# Patient Record
Sex: Male | Born: 1955 | Race: Black or African American | Hispanic: No | Marital: Married | Smoking: Current every day smoker
Health system: Southern US, Community
[De-identification: ages and names within clinical notes are randomized; demographics above are authoritative.]

---

## 2008-05-10 ENCOUNTER — Emergency Department: Payer: Self-pay | Admitting: Emergency Medicine

## 2011-04-12 ENCOUNTER — Emergency Department: Payer: Self-pay | Admitting: Emergency Medicine

## 2014-10-31 ENCOUNTER — Emergency Department: Payer: Self-pay | Admitting: Emergency Medicine

## 2014-10-31 LAB — COMPREHENSIVE METABOLIC PANEL
ALK PHOS: 74 U/L
ALT: 260 U/L — AB
AST: 197 U/L — AB (ref 15–37)
Albumin: 3.4 g/dL (ref 3.4–5.0)
Anion Gap: 1 — ABNORMAL LOW (ref 7–16)
BUN: 13 mg/dL (ref 7–18)
Bilirubin,Total: 0.6 mg/dL (ref 0.2–1.0)
CO2: 26 mmol/L (ref 21–32)
Calcium, Total: 8.7 mg/dL (ref 8.5–10.1)
Chloride: 113 mmol/L — ABNORMAL HIGH (ref 98–107)
Creatinine: 1.01 mg/dL (ref 0.60–1.30)
EGFR (Non-African Amer.): >60
Glucose: 81 mg/dL (ref 65–99)
Osmolality: 279 (ref 275–301)
Potassium: 4 mmol/L (ref 3.5–5.1)
Sodium: 140 mmol/L (ref 136–145)
Total Protein: 7.8 g/dL (ref 6.4–8.2)

## 2014-10-31 LAB — CBC WITH DIFFERENTIAL/PLATELET
Basophil #: 0 10*3/uL (ref 0.0–0.1)
Basophil %: 0.8 %
EOS ABS: 0.1 10*3/uL (ref 0.0–0.7)
Eosinophil %: 1.2 %
HCT: 41.4 % (ref 40.0–52.0)
HGB: 13.7 g/dL (ref 13.0–18.0)
LYMPHS ABS: 1.7 10*3/uL (ref 1.0–3.6)
Lymphocyte %: 29.3 %
MCH: 31.4 pg (ref 26.0–34.0)
MCHC: 33 g/dL (ref 32.0–36.0)
MCV: 95 fL (ref 80–100)
MONO ABS: 0.5 x10 3/mm (ref 0.2–1.0)
MONOS PCT: 8.2 %
NEUTROS ABS: 3.4 10*3/uL (ref 1.4–6.5)
Neutrophil %: 60.5 %
Platelet: 146 10*3/uL — ABNORMAL LOW (ref 150–440)
RBC: 4.35 10*6/uL — AB (ref 4.40–5.90)
RDW: 13.1 % (ref 11.5–14.5)
WBC: 5.7 10*3/uL (ref 3.8–10.6)
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
## 2016-12-12 ENCOUNTER — Encounter: Payer: Self-pay | Admitting: Emergency Medicine

## 2016-12-12 ENCOUNTER — Emergency Department
Admission: EM | Admit: 2016-12-12 | Discharge: 2016-12-12 | Disposition: A | Discharge status: Home / Self Care | Payer: Self-pay | Attending: Emergency Medicine | Admitting: Emergency Medicine

## 2016-12-12 DIAGNOSIS — F17210 Nicotine dependence, cigarettes, uncomplicated: Secondary | ICD-10-CM | POA: Insufficient documentation

## 2016-12-12 DIAGNOSIS — M5441 Lumbago with sciatica, right side: Secondary | ICD-10-CM | POA: Insufficient documentation

## 2016-12-12 MED ORDER — KETOROLAC TROMETHAMINE 60 MG/2ML IM SOLN
30.0000 mg | Freq: Once | INTRAMUSCULAR | Status: AC
  Administered 2016-12-12: 30 mg via INTRAMUSCULAR
  Filled 2016-12-12: qty 2

## 2016-12-12 MED ORDER — NABUMETONE 750 MG PO TABS: 750.0000 mg | ORAL_TABLET | Freq: Two times a day (BID) | ORAL | 0 refills | Status: AC

## 2016-12-12 MED ORDER — CYCLOBENZAPRINE HCL 5 MG PO TABS: 5.0000 mg | ORAL_TABLET | Freq: Three times a day (TID) | ORAL | 0 refills | Status: AC | PRN

## 2016-12-12 MED ORDER — ORPHENADRINE CITRATE 30 MG/ML IJ SOLN
60.0000 mg | INTRAMUSCULAR | Status: AC
  Administered 2016-12-12: 60 mg via INTRAMUSCULAR
  Filled 2016-12-12: qty 2

## 2016-12-12 NOTE — ED Triage Notes (Signed)
States 3rd and 4th toe R  foot red and numb x one week.
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
## 2016-12-12 NOTE — Discharge Instructions (Signed)
Your exam and symptoms are consistent with sciatic nerve irritation. Your exam does not show any serious nerve injury. You should take the prescription meds as directed. Apply ice or moist heat to any sore muscles. Follow-up with one of the local community clinics or [facility] for [facility]. Return to the ED for worsening symptoms.

## 2017-04-10 ENCOUNTER — Emergency Department
Admission: EM | Admit: 2017-04-10 | Discharge: 2017-04-10 | Disposition: A | Discharge status: Home / Self Care | Payer: Self-pay | Attending: Emergency Medicine | Admitting: Emergency Medicine

## 2017-04-10 ENCOUNTER — Emergency Department: Payer: Self-pay

## 2017-04-10 ENCOUNTER — Encounter: Payer: Self-pay | Admitting: Emergency Medicine

## 2017-04-10 DIAGNOSIS — M5431 Sciatica, right side: Secondary | ICD-10-CM | POA: Insufficient documentation

## 2017-04-10 DIAGNOSIS — Z79899 Other long term (current) drug therapy: Secondary | ICD-10-CM | POA: Insufficient documentation

## 2017-04-10 DIAGNOSIS — F17210 Nicotine dependence, cigarettes, uncomplicated: Secondary | ICD-10-CM | POA: Insufficient documentation

## 2017-04-10 MED ORDER — GABAPENTIN 600 MG PO TABS
300.0000 mg | ORAL_TABLET | Freq: Once | ORAL | Status: AC
  Administered 2017-04-10: 300 mg via ORAL
  Filled 2017-04-10: qty 1

## 2017-04-10 MED ORDER — GABAPENTIN 300 MG PO CAPS: 300.0000 mg | ORAL_CAPSULE | Freq: Two times a day (BID) | ORAL | 0 refills | Status: AC

## 2017-04-10 NOTE — ED Provider Notes (Signed)
[facility] Emergency Department Provider Note ____________________________________________  Time seen: 2142  I have reviewed the triage vital signs and the nursing notes.  HISTORY  Chief Complaint  Leg Pain  HPI Joseph Hammond is a 61 y.o. male returns to the ED for complaints of right leg pain and numbness to the toes. Patient with a previous diagnosis of sciatic nerve irritation reports that his toes are numb all the time. He localizes the paresthesias to the middle 3 toes on the right foot. He denies any injury, accident, or trauma. Denies any leg weakness, foot drop, or incontinence. He has completed the previously prescribed Relafen and Flexeril but denies any significant benefit in his symptoms. He has taken Aleve in the interim and also denies any lasting benefit. He has not been evaluated by the specialists for this complaint. This is his second ED visit for the same complaint.  History reviewed. No pertinent past medical history.  There are no active problems to display for this patient.   History reviewed. No pertinent surgical history.  Prior to Admission medications   Medication Sig Start Date End Date Taking? Authorizing Provider  cyclobenzaprine (FLEXERIL) 5 MG tablet Take 1 tablet (5 mg total) by mouth 3 (three) times daily as needed for muscle spasms. 12/12/16   Melvenia Favela, Charlesetta IvoryJenise V Bacon, PA-C  gabapentin (NEURONTIN) 300 MG capsule Take 1 capsule (300 mg total) by mouth 2 (two) times daily. 04/10/17 05/10/17  Kyrin Garn, Charlesetta IvoryJenise V Bacon, PA-C  nabumetone (RELAFEN) 750 MG tablet Take 1 tablet (750 mg total) by mouth 2 (two) times daily. 12/12/16   Hilda Wexler, Charlesetta IvoryJenise V Bacon, PA-C    Allergies Advil [ibuprofen] and Sudafed [pseudoephedrine hcl]  No family history on file.  Social History Social History  Substance Use Topics  . Smoking status: Current Every Day Smoker    Packs/day: 0.50    Types: Cigarettes  . Smokeless tobacco: Current User  .  Alcohol use Yes    Review of Systems  Constitutional: Negative for fever. Cardiovascular: Negative for chest pain. Respiratory: Negative for shortness of breath. Gastrointestinal: Negative for abdominal pain, vomiting and diarrhea. Genitourinary: Negative for dysuria. Musculoskeletal: Negative for back pain.  Skin: Negative for rash. Neurological: Negative for headaches, focal weakness or numbness. Right lower extremity paresthesias as above. ____________________________________________  PHYSICAL EXAM:  VITAL SIGNS: ED Triage Vitals  Enc Vitals Group     BP 04/10/17 2107 104/74     Pulse Rate 04/10/17 2107 99     Resp 04/10/17 2107 20     Temp 04/10/17 2107 100.2 F (37.9 C)     Temp Source 04/10/17 2107 Oral     SpO2 04/10/17 2107 98 %     Weight 04/10/17 2108 130 lb (59 kg)     Height 04/10/17 2108 5\' 7"  (1.702 m)     Head Circumference --      Peak Flow --      Pain Score 04/10/17 2107 8     Pain Loc --      Pain Edu? --      Excl. in GC? --     Constitutional: Alert and oriented. Well appearing and in no distress. Head: Normocephalic and atraumatic. Cardiovascular: Normal rate, regular rhythm. Normal distal pulses. Respiratory: Normal respiratory effort. No wheezes/rales/rhonchi. Musculoskeletal: Nontender with normal range of motion in all extremities.  Neurologic: Cranial nerves II through XII grossly intact. Normal LE DTRs bilaterally. Normal toe dorsiflexion and eversion. Normal gait without ataxia. Normal speech and  language. No gross focal neurologic deficits are appreciated. Skin:  Skin is warm, dry and intact. No rash noted. ____________________________________________   RADIOLOGY  Lumbar Spine  IMPRESSION: Negative. ____________________________________________  PROCEDURES  Gabapentin 300 mg PO ____________________________________________  INITIAL IMPRESSION / ASSESSMENT AND PLAN / ED COURSE  A short symptoms consistent with right-sided sciatic  irritation. His x-ray is actually reassuring for any acute process or any degenerative changes. He will be referred to [facility] for ongoing symptom management. He is declining any injection medication at this visit. He will be started on Gabapentin for nerve pain and paresthesias. Return precautions are reviewed. ____________________________________________  FINAL CLINICAL IMPRESSION(S) / ED DIAGNOSES  Final diagnoses:  Sciatica of right side      Karmen Stabs, Charlesetta Ivory, PA-C 04/10/17 2312    Loleta Rose, MD 04/10/17 2323

## 2017-04-10 NOTE — Discharge Instructions (Signed)
Your x-ray is essentially normal. You are being treated for sciatica and nerve pain. Take the prescription medicine as directed. Take OTC Aleve as needed. [facility] symptoms.

## 2017-04-10 NOTE — ED Notes (Signed)
Patient transported to X-ray 

## 2017-04-10 NOTE — ED Triage Notes (Signed)
Pt presents to the ER from home with complaints of right leg pain and right toes numbing, Reports been diagnosed with sciatic nerve reports numbed all the time, denies any other symptom at present. Pt talks in complete sentences no respiratory distress noted.

## 2017-12-19 IMAGING — CR DG LUMBAR SPINE COMPLETE 4+V
1 series · 5 of 5 positions shown · non-contrast
Comparison: None.

CLINICAL DATA: Low back pain

EXAM:
LUMBAR SPINE - COMPLETE 4+ VIEW

[Series 1: t lumbar spine ap · 0.14mm/px · 5 of 5 slices shown]
[im 1/5]
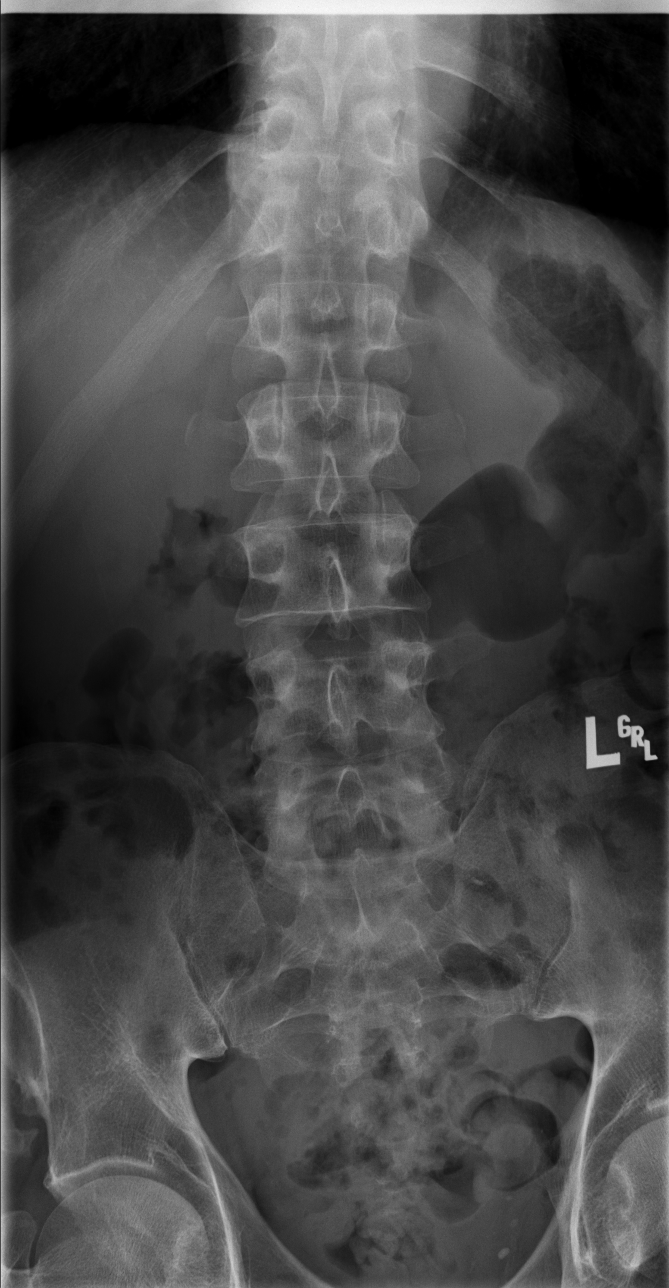
[im 2/5]
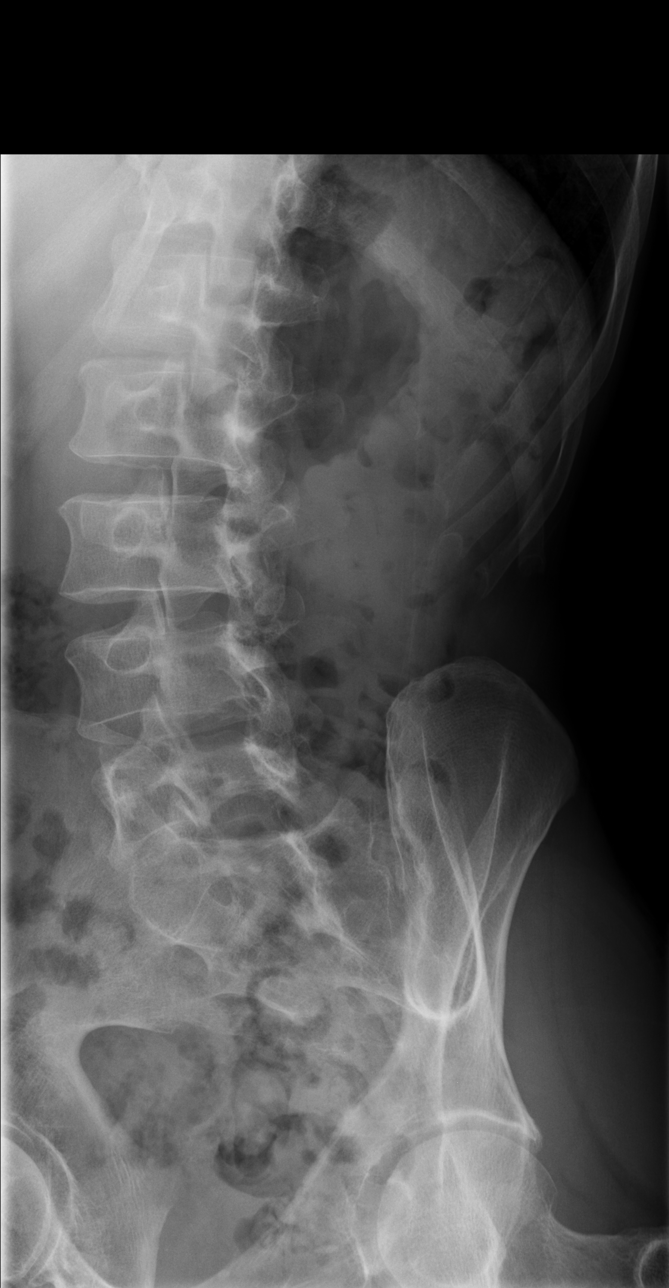
[im 3/5]
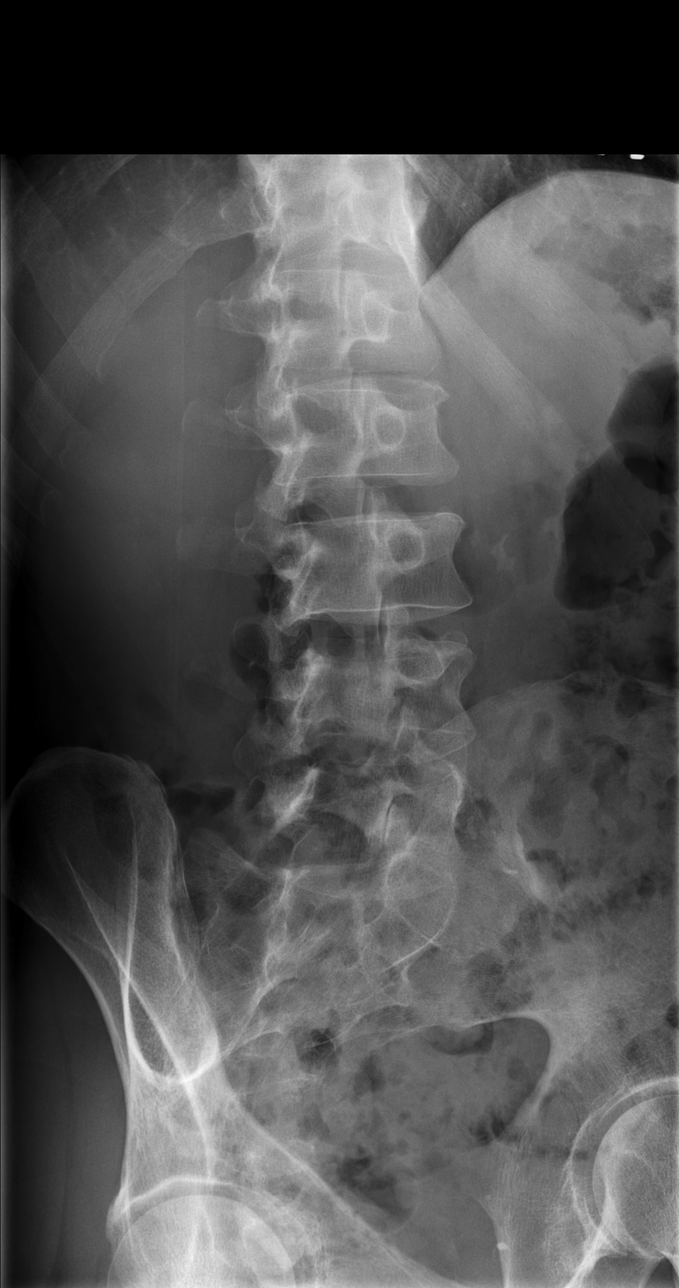
[im 4/5]
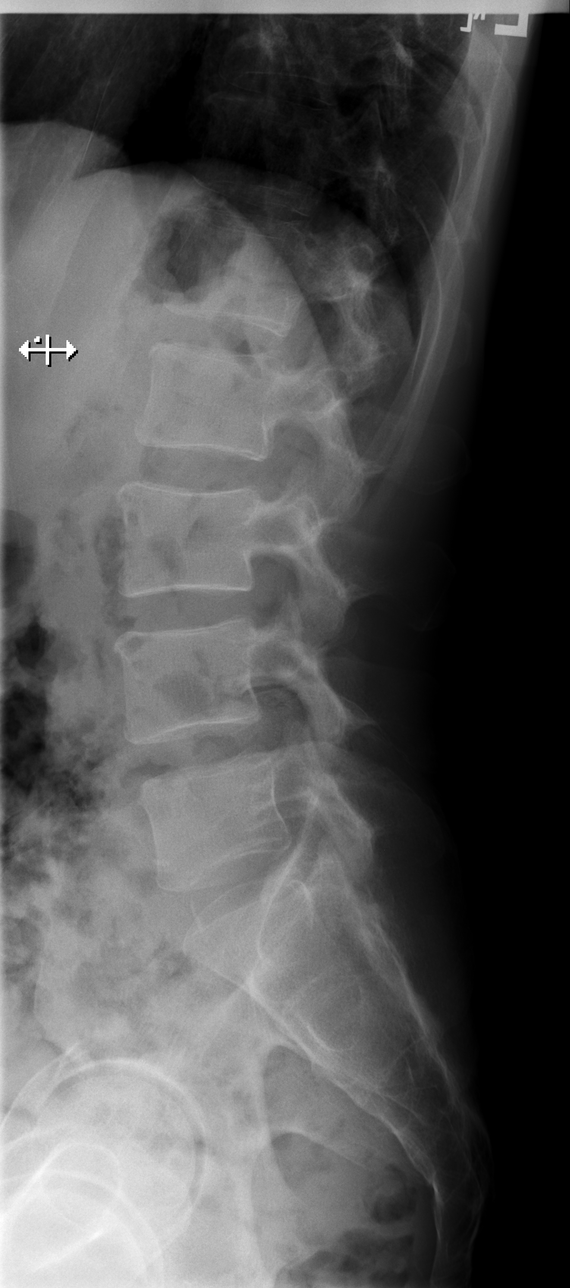
[im 5/5]
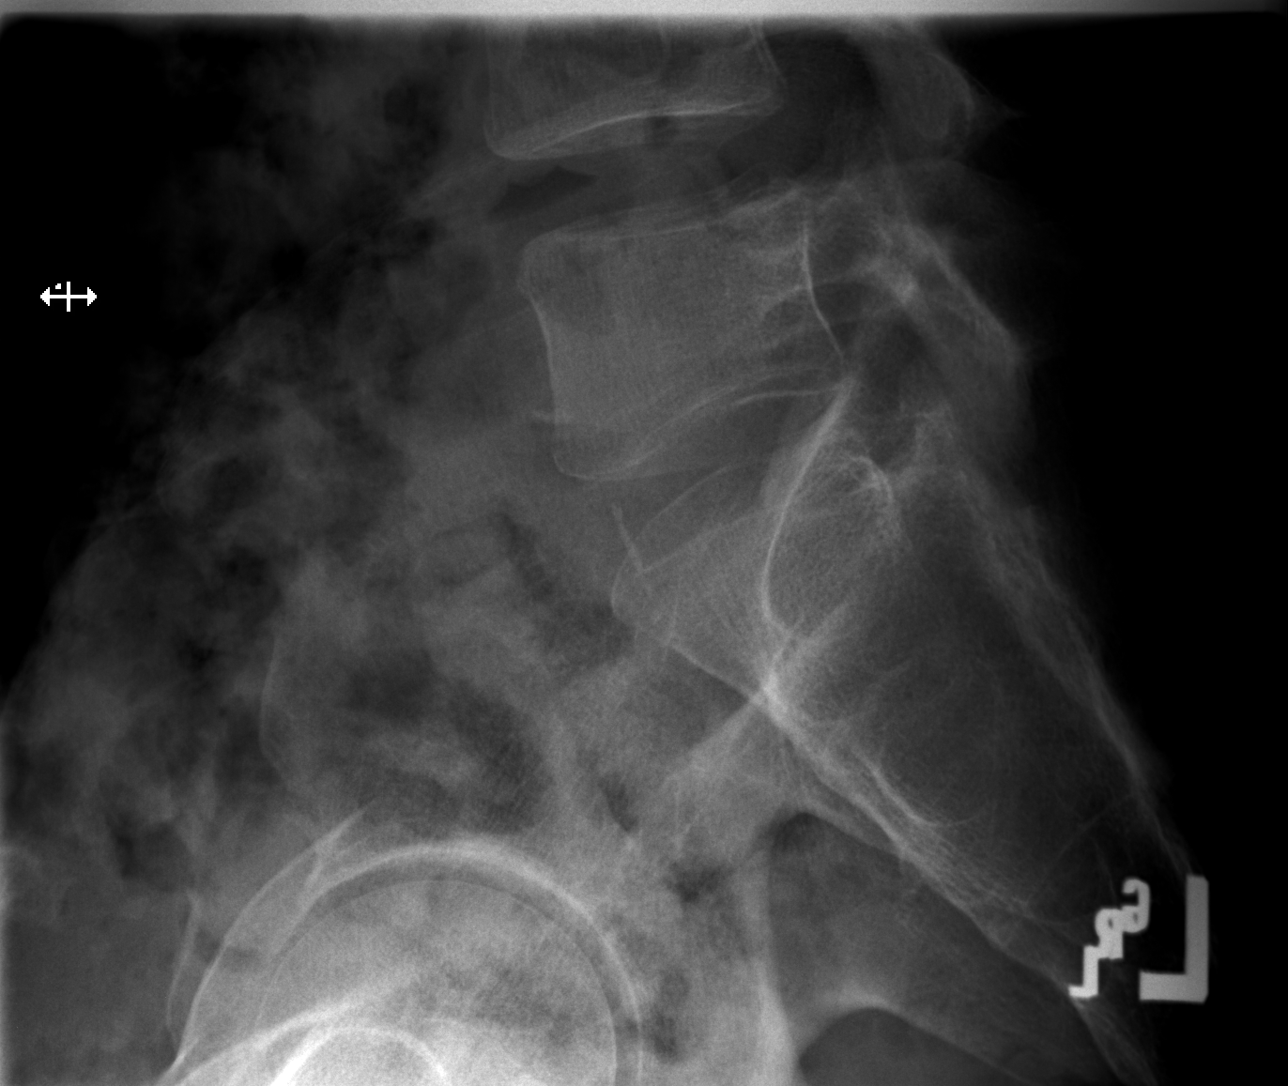

[5 of 5 positions shown; findings below may reference images not displayed]

FINDINGS: There is no evidence of lumbar spine fracture. Alignment is normal.
Intervertebral disc spaces are maintained.
IMPRESSION: Negative.

## 2018-01-15 ENCOUNTER — Emergency Department: Admission: EM | Admit: 2018-01-15 | Discharge: 2018-01-15 | Discharge status: Against Medical Advice | Payer: Self-pay

## 2019-11-23 ENCOUNTER — Emergency Department
Admission: EM | Admit: 2019-11-23 | Discharge: 2019-11-23 | Disposition: A | Discharge status: Home / Self Care | Payer: Self-pay | Attending: Emergency Medicine | Admitting: Emergency Medicine

## 2019-11-23 ENCOUNTER — Other Ambulatory Visit: Payer: Self-pay

## 2019-11-23 DIAGNOSIS — M79604 Pain in right leg: Secondary | ICD-10-CM | POA: Insufficient documentation

## 2019-11-23 DIAGNOSIS — Z5321 Procedure and treatment not carried out due to patient leaving prior to being seen by health care provider: Secondary | ICD-10-CM | POA: Insufficient documentation

## 2019-11-23 NOTE — ED Triage Notes (Signed)
Pt c/o recurrent right leg pain x2 months that he feels is worse today and not relieved by Aleve - pt reports that he has sciatic pain and has been seen here for same

## 2019-11-23 NOTE — ED Notes (Signed)
Pt stated he was leaving and that he had been waiting for an hour to be taken back to room.  Pt here with minor grandchild who walked out with patient.
